# Patient Record
Sex: Male | Born: 1975 | Race: White | Hispanic: No | Marital: Married | Smoking: Never smoker
Health system: Southern US, Community
[De-identification: ages and names within clinical notes are randomized; demographics above are authoritative.]

## PROBLEM LIST (undated history)

## (undated) DIAGNOSIS — N200 Calculus of kidney: Secondary | ICD-10-CM

## (undated) DIAGNOSIS — J45909 Unspecified asthma, uncomplicated: Secondary | ICD-10-CM

---

## 1999-07-23 ENCOUNTER — Emergency Department (HOSPITAL_COMMUNITY): Admission: EM | Admit: 1999-07-23 | Discharge: 1999-07-23 | Payer: Self-pay | Admitting: Emergency Medicine

## 2000-07-11 ENCOUNTER — Emergency Department (HOSPITAL_COMMUNITY): Admission: EM | Admit: 2000-07-11 | Discharge: 2000-07-11 | Payer: Self-pay | Admitting: *Deleted

## 2006-11-07 ENCOUNTER — Emergency Department (HOSPITAL_COMMUNITY): Admission: EM | Admit: 2006-11-07 | Discharge: 2006-11-07 | Payer: Self-pay | Admitting: Emergency Medicine

## 2006-12-29 ENCOUNTER — Emergency Department (HOSPITAL_COMMUNITY): Admission: EM | Admit: 2006-12-29 | Discharge: 2006-12-29 | Payer: Self-pay | Admitting: Emergency Medicine

## 2008-03-08 ENCOUNTER — Emergency Department (HOSPITAL_COMMUNITY): Admission: EM | Admit: 2008-03-08 | Discharge: 2008-03-08 | Payer: Self-pay | Admitting: Emergency Medicine

## 2009-03-07 ENCOUNTER — Emergency Department (HOSPITAL_COMMUNITY): Admission: EM | Admit: 2009-03-07 | Discharge: 2009-03-07 | Payer: Self-pay | Admitting: Emergency Medicine

## 2010-10-23 ENCOUNTER — Emergency Department (HOSPITAL_COMMUNITY)
Admission: EM | Admit: 2010-10-23 | Discharge: 2010-10-23 | Disposition: A | Discharge status: Home / Self Care | Payer: Self-pay | Attending: Emergency Medicine | Admitting: Emergency Medicine

## 2010-10-23 DIAGNOSIS — K029 Dental caries, unspecified: Secondary | ICD-10-CM | POA: Insufficient documentation

## 2010-10-23 DIAGNOSIS — K089 Disorder of teeth and supporting structures, unspecified: Secondary | ICD-10-CM | POA: Insufficient documentation

## 2010-11-03 ENCOUNTER — Emergency Department (HOSPITAL_COMMUNITY): Payer: Self-pay

## 2010-11-03 ENCOUNTER — Emergency Department (HOSPITAL_COMMUNITY)
Admission: EM | Admit: 2010-11-03 | Discharge: 2010-11-03 | Disposition: A | Discharge status: Home / Self Care | Payer: Self-pay | Attending: Emergency Medicine | Admitting: Emergency Medicine

## 2010-11-03 DIAGNOSIS — M549 Dorsalgia, unspecified: Secondary | ICD-10-CM | POA: Insufficient documentation

## 2010-11-03 DIAGNOSIS — X503XXA Overexertion from repetitive movements, initial encounter: Secondary | ICD-10-CM | POA: Insufficient documentation

## 2010-11-03 DIAGNOSIS — S335XXA Sprain of ligaments of lumbar spine, initial encounter: Secondary | ICD-10-CM | POA: Insufficient documentation

## 2011-03-20 LAB — RAPID STREP SCREEN (MED CTR MEBANE ONLY): Streptococcus, Group A Screen (Direct): POSITIVE — AB

## 2012-06-09 ENCOUNTER — Emergency Department (HOSPITAL_COMMUNITY): Payer: Self-pay

## 2012-06-09 ENCOUNTER — Encounter (HOSPITAL_COMMUNITY): Payer: Self-pay | Admitting: Emergency Medicine

## 2012-06-09 ENCOUNTER — Emergency Department (HOSPITAL_COMMUNITY)
Admission: EM | Admit: 2012-06-09 | Discharge: 2012-06-09 | Disposition: A | Discharge status: Home / Self Care | Payer: Self-pay | Attending: Emergency Medicine | Admitting: Emergency Medicine

## 2012-06-09 DIAGNOSIS — J069 Acute upper respiratory infection, unspecified: Secondary | ICD-10-CM | POA: Insufficient documentation

## 2012-06-09 DIAGNOSIS — Z79899 Other long term (current) drug therapy: Secondary | ICD-10-CM | POA: Insufficient documentation

## 2012-06-09 DIAGNOSIS — R059 Cough, unspecified: Secondary | ICD-10-CM | POA: Insufficient documentation

## 2012-06-09 DIAGNOSIS — R05 Cough: Secondary | ICD-10-CM | POA: Insufficient documentation

## 2012-06-09 LAB — POCT I-STAT, CHEM 8
BUN: 15 mg/dL (ref 6–23)
Chloride: 103 mEq/L (ref 96–112)
Sodium: 142 mEq/L (ref 135–145)
TCO2: 29 mmol/L (ref 0–100)

## 2012-06-09 MED ORDER — AZITHROMYCIN 250 MG PO TABS: ORAL_TABLET | ORAL | Status: DC

## 2012-06-09 NOTE — ED Notes (Signed)
Provided pt with ginger ale for PO challenge.

## 2012-06-09 NOTE — ED Notes (Signed)
Pt reports cough, fever, chest wall pain, generalized aching x 10 days. Loose stools x 5 days. Denies shortness of breath , denies headache at present. Self medicated with sudafed, tylenol, zyrtac

## 2012-06-09 NOTE — ED Provider Notes (Signed)
Medical screening examination/treatment/procedure(s) were performed by non-physician practitioner and as supervising physician I was immediately available for consultation/collaboration.  Derwood Kaplan, MD 06/09/12 1742

## 2012-06-09 NOTE — ED Provider Notes (Signed)
History     CSN: [id number]  Arrival date & time 06/09/12  1049   First MD Initiated Contact with Patient 06/09/12 1119      Chief Complaint  Patient presents with  . Fever  . Cough    productive cough x 1.5 weeks  . Chills    (Consider location/radiation/quality/duration/timing/severity/associated sxs/prior treatment) HPI  Jacob Mayer is a 37 y.o. male complaining of a week to week and subjective fever, intermittently productive cough, loose stools and myalgia. Patient reports feeling better and then she returned today. He denies nausea vomiting, headache,  shortness of breath.   History reviewed. No pertinent past medical history.  History reviewed. No pertinent past surgical history.  Family History  Problem Relation Age of Onset  . Diabetes Mother   . Migraines Mother   . Asthma Mother     History  Substance Use Topics  . Smoking status: Never Smoker   . Smokeless tobacco: Not on file  . Alcohol Use: No      Review of Systems  Constitutional: Negative for fever.  Respiratory: Positive for cough. Negative for shortness of breath.   Cardiovascular: Negative for chest pain.  Gastrointestinal: Negative for nausea, vomiting, abdominal pain and diarrhea.  All other systems reviewed and are negative.    Allergies  Aspirin  Home Medications   Current Outpatient Rx  Name  Route  Sig  Dispense  Refill  . ACETAMINOPHEN 325 MG PO TABS   Oral   Take 650 mg by mouth every 6 (six) hours as needed. Fever         . ALBUTEROL SULFATE HFA 108 (90 BASE) MCG/ACT IN AERS   Inhalation   Inhale 2 puffs into the lungs every 6 (six) hours as needed. Shortness of breath         . CETIRIZINE HCL 10 MG PO TABS   Oral   Take 10 mg by mouth daily.         Marland Kitchen FLUTICASONE PROPIONATE 50 MCG/ACT NA SUSP   Nasal   Place 2 sprays into the nose daily.         Marland Kitchen MENTHOL 10 MG MT LOZG   Mouth/Throat   Use as directed 2 lozenges in the mouth or throat.         Marland Kitchen  PSEUDOEPHEDRINE HCL 30 MG PO TABS   Oral   Take 30 mg by mouth every 4 (four) hours as needed. Cold sympt.         Marland Kitchen PSEUDOEPHEDRINE-ACETAMINOPHEN 30-500 MG PO TABS   Oral   Take 1 tablet by mouth every 4 (four) hours as needed. Pain           BP 125/81  Pulse 88  Temp 99.4 F (37.4 C) (Oral)  Resp 20  Ht 5\' 9"  (1.753 m)  Wt 145 lb (65.772 kg)  BMI 21.41 kg/m2  SpO2 97%  Physical Exam  Nursing note and vitals reviewed. Constitutional: He is oriented to person, place, and time. He appears well-developed and well-nourished. No distress.  HENT:  Head: Normocephalic and atraumatic.  Mouth/Throat: Oropharynx is clear and moist.  Eyes: Conjunctivae normal and EOM are normal. Pupils are equal, round, and reactive to light.  Neck: Normal range of motion.  Cardiovascular: Normal rate, regular rhythm and intact distal pulses.   No murmur heard. Pulmonary/Chest: Effort normal and breath sounds normal. No stridor. No respiratory distress. He has no wheezes. He has no rales. He exhibits no tenderness.  Abdominal: Soft.  Bowel sounds are normal. He exhibits no distension and no mass. There is no tenderness. There is no rebound and no guarding.  Musculoskeletal: Normal range of motion.  Neurological: He is alert and oriented to person, place, and time.  Psychiatric: He has a normal mood and affect.    ED Course  Procedures (including critical care time)   Labs Reviewed  POCT I-STAT, CHEM 8   Dg Chest 2 View  06/09/2012  *RADIOLOGY REPORT*  Clinical Data: Shortness of breath.  Cough.  Fever.  CHEST - 2 VIEW  Comparison: None.  Findings: Cardiac and mediastinal contours appear normal.  The lungs appear clear.  No pleural effusion is identified.  IMPRESSION:  No significant abnormality identified.   Original Report Authenticated By: Gaylyn Rong, M.D.      1. URI (upper respiratory infection)       MDM   Patient with 7-10 days of URI like illness. I-STAT and chest x-ray  are clear. I will give a when necessary prescription for Z-Pak.   Pt verbalized understanding and agrees with care plan. Outpatient follow-up and return precautions given.    New Prescriptions   AZITHROMYCIN (ZITHROMAX Z-PAK) 250 MG TABLET    2 po day one, then 1 daily x 4 days          Wynetta Emery, PA-C 06/09/12 1228

## 2012-11-30 ENCOUNTER — Encounter (HOSPITAL_COMMUNITY): Payer: Self-pay | Admitting: Physical Medicine and Rehabilitation

## 2012-11-30 ENCOUNTER — Emergency Department (HOSPITAL_COMMUNITY)
Admission: EM | Admit: 2012-11-30 | Discharge: 2012-11-30 | Disposition: A | Discharge status: Home / Self Care | Payer: Self-pay | Attending: Emergency Medicine | Admitting: Emergency Medicine

## 2012-11-30 DIAGNOSIS — Z79899 Other long term (current) drug therapy: Secondary | ICD-10-CM | POA: Insufficient documentation

## 2012-11-30 DIAGNOSIS — Z23 Encounter for immunization: Secondary | ICD-10-CM | POA: Insufficient documentation

## 2012-11-30 DIAGNOSIS — S65212A Laceration of superficial palmar arch of left hand, initial encounter: Secondary | ICD-10-CM

## 2012-11-30 DIAGNOSIS — Y9389 Activity, other specified: Secondary | ICD-10-CM | POA: Insufficient documentation

## 2012-11-30 DIAGNOSIS — Y929 Unspecified place or not applicable: Secondary | ICD-10-CM | POA: Insufficient documentation

## 2012-11-30 DIAGNOSIS — W260XXA Contact with knife, initial encounter: Secondary | ICD-10-CM | POA: Insufficient documentation

## 2012-11-30 DIAGNOSIS — S61409A Unspecified open wound of unspecified hand, initial encounter: Secondary | ICD-10-CM | POA: Insufficient documentation

## 2012-11-30 MED ORDER — TETANUS-DIPHTH-ACELL PERTUSSIS 5-2.5-18.5 LF-MCG/0.5 IM SUSP
0.5000 mL | Freq: Once | INTRAMUSCULAR | Status: AC
  Administered 2012-11-30: 0.5 mL via INTRAMUSCULAR
  Filled 2012-11-30: qty 0.5

## 2012-11-30 NOTE — ED Notes (Signed)
Pt presents to department for evaluation of L hand laceration. States he was playing with knife and accidentally cut hand. 0.5inch laceration noted to palm of hand, bleeding controlled upon arrival to ED. Tetanus unknown. Pt is conscious alert and oriented x4.

## 2012-11-30 NOTE — ED Provider Notes (Signed)
Medical screening examination/treatment/procedure(s) were performed by non-physician practitioner and as supervising physician I was immediately available for consultation/collaboration.   Rolan Bucco, MD 11/30/12 [phone]

## 2012-11-30 NOTE — ED Notes (Signed)
Washed laceration with wound cleanser and then applied 4x4, wrapped with dry gauze and taped

## 2012-11-30 NOTE — ED Provider Notes (Signed)
History    This chart was scribed for non-physician practitioner Dierdre Forth PA-C working with Rolan Bucco, MD by Smitty Pluck, ED scribe. This patient was seen in room TR08C/TR08C and the patient's care was started at 6:38PM.  CSN: [id number] Arrival date & time 11/30/12  1749    Chief Complaint  Patient presents with  . Laceration    Patient is a 37 y.o. male presenting with skin laceration. The history is provided by the patient and medical records. No language interpreter was used.  Laceration  HPI Comments: Jacob Mayer is a 37 y.o. male who presents to the Emergency Department complaining of left hand laceration onset today. Pt states that he was playing with a knife and accidentally cut his left hand. He states he used peroxide and bleeding is controlled. Pt denies trouble moving left hand, fever, chills, nausea, vomiting, diarrhea, weakness, cough, SOB and any other pain. Pt does not know when his last tetanus shot was.  Nothing makes the pain better or worse.     No past medical history on file. No past surgical history on file. Family History  Problem Relation Age of Onset  . Diabetes Mother   . Migraines Mother   . Asthma Mother    History  Substance Use Topics  . Smoking status: Never Smoker   . Smokeless tobacco: Not on file  . Alcohol Use: No    Review of Systems  Constitutional: Negative for fever, diaphoresis, appetite change, fatigue and unexpected weight change.  HENT: Negative for mouth sores and neck stiffness.   Eyes: Negative for visual disturbance.  Respiratory: Negative for cough, chest tightness, shortness of breath and wheezing.   Cardiovascular: Negative for chest pain.  Gastrointestinal: Negative for nausea, vomiting, abdominal pain, diarrhea and constipation.  Endocrine: Negative for polydipsia, polyphagia and polyuria.  Genitourinary: Negative for dysuria, urgency, frequency and hematuria.  Musculoskeletal: Negative for back pain.   Skin: Positive for wound (left hand laceration). Negative for rash.  Allergic/Immunologic: Negative for immunocompromised state.  Neurological: Negative for syncope, light-headedness and headaches.  Hematological: Does not bruise/bleed easily.  Psychiatric/Behavioral: Negative for sleep disturbance. The patient is not nervous/anxious.     Allergies  Aspirin  Home Medications   Current Outpatient Rx  Name  Route  Sig  Dispense  Refill  . albuterol (PROVENTIL HFA;VENTOLIN HFA) 108 (90 BASE) MCG/ACT inhaler   Inhalation   Inhale 2 puffs into the lungs every 6 (six) hours as needed. Shortness of breath         . Multiple Vitamins-Minerals (MULTIVITAMIN GUMMIES ADULTS) CHEW   Oral   Chew 2 tablets by mouth daily.          BP 157/85  Pulse 79  Temp(Src) 98.2 F (36.8 C) (Oral)  Resp 18  SpO2 97%  Physical Exam  Nursing note and vitals reviewed. Constitutional: He appears well-developed and well-nourished. No distress.  HENT:  Head: Normocephalic and atraumatic.  Eyes: Conjunctivae are normal.  Neck: Normal range of motion.  Cardiovascular: Normal rate, regular rhythm and intact distal pulses.   Capillary refill less than 3 seconds  Pulmonary/Chest: Effort normal and breath sounds normal.  Musculoskeletal: He exhibits tenderness. He exhibits no edema.  Full ROM of all fingers of left hand  Neurological: He is alert. Coordination normal.  sensation intact 5/5 strength of left hand with extension and flexion   Skin: Skin is warm and dry. He is not diaphoretic.  No tenting of the skin  3 cm laceration  to the medial palmer of the left hand.   Psychiatric: He has a normal mood and affect.    ED Course  Procedures (including critical care time) DIAGNOSTIC STUDIES: Oxygen Saturation is 97% on room air, normal by my interpretation.    COORDINATION OF CARE: 6:51 PM Discussed ED treatment with pt and pt agrees.   LACERATION REPAIR PROCEDURE NOTE The patient's  identification was confirmed and consent was obtained. This procedure was performed by Jacob Client Kayleah Appleyard PA-C at 6:51 PM. Site: left medial hand  Sterile procedures observed betadine Anesthetic used (type and amt): 3 ml Suture type/size: 6-O proline Length:3 cm # of Sutures: 4 Technique:simple interrupted Complexitysimple Antibx ointment applied Tetanus ordered Site anesthetized, irrigated with NS, explored without evidence of foreign body, wound well approximated, site covered with dry, sterile dressing.  Patient tolerated procedure well without complications. Instructions for care discussed verbally and patient provided with additional written instructions for homecare and f/u.     Labs Reviewed - No data to display No results found. 1. Laceration of superficial palmar arch of hand, left, initial encounter     MDM  Jacob Mayer presents with laceration.  Tdap booster given.Pressure irrigation performed. Laceration occurred < 8 hours prior to repair which was well tolerated. Pt has no co morbidities to effect normal wound healing. Discussed suture home care w pt and answered questions. Pt to f-u for wound check and suture removal in 7 days. Pt is hemodynamically stable w no complaints prior to dc.   I have also discussed reasons to return immediately to the ER.  Patient expresses understanding and agrees with plan.  I personally performed the services described in this documentation, which was scribed in my presence. The recorded information has been reviewed and is accurate.   Jacob Client Kwynn Schlotter, PA-C 11/30/12 2240

## 2016-10-02 ENCOUNTER — Other Ambulatory Visit: Payer: Self-pay | Admitting: Family Medicine

## 2016-10-02 DIAGNOSIS — R319 Hematuria, unspecified: Secondary | ICD-10-CM

## 2016-10-03 ENCOUNTER — Ambulatory Visit
Admission: RE | Admit: 2016-10-03 | Discharge: 2016-10-03 | Disposition: A | Discharge status: Home / Self Care | Payer: BLUE CROSS/BLUE SHIELD | Source: Ambulatory Visit | Attending: Family Medicine | Admitting: Family Medicine

## 2016-10-03 DIAGNOSIS — R319 Hematuria, unspecified: Secondary | ICD-10-CM

## 2016-10-06 ENCOUNTER — Encounter: Payer: Self-pay | Admitting: *Deleted

## 2016-10-06 ENCOUNTER — Emergency Department
Admission: EM | Admit: 2016-10-06 | Discharge: 2016-10-06 | Disposition: A | Discharge status: Home / Self Care | Payer: BLUE CROSS/BLUE SHIELD | Attending: Emergency Medicine | Admitting: Emergency Medicine

## 2016-10-06 DIAGNOSIS — N200 Calculus of kidney: Secondary | ICD-10-CM | POA: Insufficient documentation

## 2016-10-06 LAB — BASIC METABOLIC PANEL
Anion gap: 7 (ref 5–15)
BUN: 17 mg/dL (ref 6–20)
CHLORIDE: 105 mmol/L (ref 101–111)
CO2: 28 mmol/L (ref 22–32)
Calcium: 8.9 mg/dL (ref 8.9–10.3)
Creatinine, Ser: 1.09 mg/dL (ref 0.61–1.24)
GFR calc Af Amer: >60 mL/min (ref >60)
GFR calc non Af Amer: >60 mL/min (ref >60)
Glucose, Bld: 127 mg/dL — ABNORMAL HIGH (ref 65–99)
POTASSIUM: 3.6 mmol/L (ref 3.5–5.1)
Sodium: 140 mmol/L (ref 135–145)

## 2016-10-06 LAB — URINALYSIS, COMPLETE (UACMP) WITH MICROSCOPIC
BILIRUBIN URINE: NEGATIVE
Bacteria, UA: NONE SEEN
Glucose, UA: NEGATIVE mg/dL
KETONES UR: NEGATIVE mg/dL
LEUKOCYTES UA: NEGATIVE
Nitrite: NEGATIVE
PROTEIN: NEGATIVE mg/dL
Specific Gravity, Urine: 1.024 (ref 1.005–1.030)
pH: 5 (ref 5.0–8.0)

## 2016-10-06 LAB — CBC WITH DIFFERENTIAL/PLATELET
Basophils Absolute: 0 10*3/uL (ref 0–0.1)
Basophils Relative: 0 %
EOS ABS: 0.4 10*3/uL (ref 0–0.7)
Eosinophils Relative: 4 %
HEMATOCRIT: 48.3 % (ref 40.0–52.0)
Hemoglobin: 16.5 g/dL (ref 13.0–18.0)
Lymphocytes Relative: 40 %
Lymphs Abs: 4.5 10*3/uL — ABNORMAL HIGH (ref 1.0–3.6)
MCH: 30 pg (ref 26.0–34.0)
MCHC: 34.1 g/dL (ref 32.0–36.0)
MCV: 87.9 fL (ref 80.0–100.0)
MONO ABS: 1.2 10*3/uL — AB (ref 0.2–1.0)
MONOS PCT: 11 %
Neutro Abs: 5 10*3/uL (ref 1.4–6.5)
Neutrophils Relative %: 45 %
Platelets: 235 10*3/uL (ref 150–440)
RBC: 5.5 MIL/uL (ref 4.40–5.90)
RDW: 13.9 % (ref 11.5–14.5)
WBC: 11.2 10*3/uL — ABNORMAL HIGH (ref 3.8–10.6)

## 2016-10-06 MED ORDER — ONDANSETRON HCL 4 MG/2ML IJ SOLN
4.0000 mg | Freq: Once | INTRAMUSCULAR | Status: AC
  Administered 2016-10-06: 4 mg via INTRAVENOUS
  Filled 2016-10-06: qty 2

## 2016-10-06 MED ORDER — MORPHINE SULFATE (PF) 4 MG/ML IV SOLN
4.0000 mg | Freq: Once | INTRAVENOUS | Status: AC
  Administered 2016-10-06: 4 mg via INTRAVENOUS
  Filled 2016-10-06: qty 1

## 2016-10-06 MED ORDER — KETOROLAC TROMETHAMINE 30 MG/ML IJ SOLN
30.0000 mg | Freq: Once | INTRAMUSCULAR | Status: AC
  Administered 2016-10-06: 30 mg via INTRAVENOUS
  Filled 2016-10-06: qty 1

## 2016-10-06 MED ORDER — SODIUM CHLORIDE 0.9 % IV BOLUS (SEPSIS)
1000.0000 mL | Freq: Once | INTRAVENOUS | Status: AC
  Administered 2016-10-06: 1000 mL via INTRAVENOUS

## 2016-10-06 MED ORDER — ONDANSETRON HCL 4 MG PO TABS: 4.0000 mg | ORAL_TABLET | Freq: Three times a day (TID) | ORAL | 0 refills | Status: DC | PRN

## 2016-10-06 MED ORDER — HYDROCODONE-ACETAMINOPHEN 5-325 MG PO TABS: 1.0000 | ORAL_TABLET | Freq: Four times a day (QID) | ORAL | 0 refills | Status: DC | PRN

## 2016-10-06 NOTE — ED Provider Notes (Signed)
[facility] Emergency Department Provider Note  ____________________________________________   I have reviewed the triage vital signs and the nursing notes.   HISTORY  Chief Complaint Abdominal Pain and Flank Pain   History limited by: Not Limited   HPI Jacob Mayer is a 41 y.o. male who presents to the emergency department today because of concerns for right lower quadrant abdominal pain. The patient first started having pain there for days ago. He had a CT done 3 days ago which showed a 2 mm right-sided ureteral stone. The pain did improve until today when it came back. It is severe. It has not been associated with any vomiting. He was able to urinate without any difficulty today. Patient denies any fevers. He has not had any kidney stones in the past. The patient has not tried any prescription medication for the pain at home.   History reviewed. No pertinent past medical history.  There are no active problems to display for this patient.   History reviewed. No pertinent surgical history.  Prior to Admission medications   Medication Sig Start Date End Date Taking? Authorizing Provider  albuterol (PROVENTIL HFA;VENTOLIN HFA) 108 (90 BASE) MCG/ACT inhaler Inhale 2 puffs into the lungs every 6 (six) hours as needed. Shortness of breath    Historical Provider, MD  Multiple Vitamins-Minerals (MULTIVITAMIN GUMMIES ADULTS) CHEW Chew 2 tablets by mouth daily.    Historical Provider, MD    Allergies Aspirin  Family History  Problem Relation Age of Onset  . Diabetes Mother   . Migraines Mother   . Asthma Mother     Social History Social History  Substance Use Topics  . Smoking status: Never Smoker  . Smokeless tobacco: Not on file  . Alcohol use No    Review of Systems Constitutional: No fever/chills Eyes: No visual changes. ENT: No sore throat. Cardiovascular: Denies chest pain. Respiratory: Denies shortness of breath. Gastrointestinal:  Positive for right lower quadrant abdominal pain. Genitourinary: Negative for dysuria. Musculoskeletal: Negative for back pain. Skin: Negative for rash. Neurological: Negative for headaches, focal weakness or numbness.  ____________________________________________   PHYSICAL EXAM:  VITAL SIGNS: ED Triage Vitals  Enc Vitals Group     BP 10/06/16 0753 (!) 166/100     Pulse Rate 10/06/16 0753 (!) 58     Resp 10/06/16 0753 20     Temp 10/06/16 0753 97.8 F (36.6 C)     Temp Source 10/06/16 0753 Oral     SpO2 10/06/16 0753 100 %     Weight 10/06/16 0748 180 lb (81.6 kg)     Height 10/06/16 0748 5\' 9"  (1.753 m)     Head Circumference --      Peak Flow --      Pain Score 10/06/16 0747 9   Constitutional: Alert and oriented. Appears uncomfortable.  Eyes: Conjunctivae are normal. Normal extraocular movements. ENT   Head: Normocephalic and atraumatic.   Nose: No congestion/rhinnorhea.   Mouth/Throat: Mucous membranes are moist.   Neck: No stridor. Hematological/Lymphatic/Immunilogical: No cervical lymphadenopathy. Cardiovascular: Normal rate, regular rhythm.  No murmurs, rubs, or gallops.  Respiratory: Normal respiratory effort without tachypnea nor retractions. Breath sounds are clear and equal bilaterally. No wheezes/rales/rhonchi. Gastrointestinal: Soft and non tender. No rebound. No guarding.  Genitourinary: Deferred Musculoskeletal: Normal range of motion in all extremities. No lower extremity edema. Neurologic:  Normal speech and language. No gross focal neurologic deficits are appreciated.  Skin:  Skin is warm, dry and intact. No rash noted.  Psychiatric: Mood and affect are normal. Speech and behavior are normal. Patient exhibits appropriate insight and judgment.  ____________________________________________    LABS (pertinent positives/negatives)  Labs Reviewed  CBC WITH DIFFERENTIAL/PLATELET - Abnormal; Notable for the following:       Result Value   WBC  11.2 (*)    Lymphs Abs 4.5 (*)    Monocytes Absolute 1.2 (*)    All other components within normal limits  BASIC METABOLIC PANEL - Abnormal; Notable for the following:    Glucose, Bld 127 (*)    All other components within normal limits  URINALYSIS, COMPLETE (UACMP) WITH MICROSCOPIC - Abnormal; Notable for the following:    Color, Urine YELLOW (*)    APPearance CLEAR (*)    Hgb urine dipstick MODERATE (*)    Squamous Epithelial / LPF 0-5 (*)    All other components within normal limits     ____________________________________________   EKG  None  ____________________________________________    RADIOLOGY  None  ____________________________________________   PROCEDURES  Procedures  ____________________________________________   INITIAL IMPRESSION / ASSESSMENT AND PLAN / ED COURSE  Pertinent labs & imaging results that were available during my care of the patient were reviewed by me and considered in my medical decision making (see chart for details).  Patient presented to the emergency department today because of concerns for right lower quadrant pain. Patient had diagnoses of kidney stone via CT scan a couple of days ago. Urine continues to have some red blood cells. At this point however no white blood cells in the urine or elevation of creatinine. Patient did feel improved after pain medication. At this point do not think repeat imaging is necessary. Will plan on discharging with pain medication. Checked [place] drug database.  ____________________________________________   FINAL CLINICAL IMPRESSION(S) / ED DIAGNOSES  Final diagnoses:  Kidney stone     Note: This dictation was prepared with Dragon dictation. Any transcriptional errors that result from this process are unintentional     Phineas SemenGraydon Keyoni Lapinski, MD 10/06/16 1200

## 2016-10-06 NOTE — Discharge Instructions (Signed)
Please seek medical attention for any high fevers, chest pain, shortness of breath, change in behavior, persistent vomiting, bloody stool or any other new or concerning symptoms.  

## 2016-10-06 NOTE — ED Triage Notes (Signed)
Pt was seen Monday diagnosed with a kidney stone, pt reports increased rigght sided abdominal pain

## 2017-10-22 ENCOUNTER — Other Ambulatory Visit: Payer: Self-pay

## 2017-10-22 ENCOUNTER — Emergency Department
Admission: EM | Admit: 2017-10-22 | Discharge: 2017-10-22 | Disposition: A | Discharge status: Home / Self Care | Payer: Self-pay | Attending: Emergency Medicine | Admitting: Emergency Medicine

## 2017-10-22 ENCOUNTER — Encounter: Payer: Self-pay | Admitting: Emergency Medicine

## 2017-10-22 DIAGNOSIS — Z79899 Other long term (current) drug therapy: Secondary | ICD-10-CM | POA: Insufficient documentation

## 2017-10-22 DIAGNOSIS — J029 Acute pharyngitis, unspecified: Secondary | ICD-10-CM | POA: Insufficient documentation

## 2017-10-22 HISTORY — DX: Calculus of kidney: N20.0

## 2017-10-22 LAB — GROUP A STREP BY PCR: Group A Strep by PCR: NOT DETECTED

## 2017-10-22 MED ORDER — MAGIC MOUTHWASH W/LIDOCAINE: 5.0000 mL | Freq: Four times a day (QID) | ORAL | 0 refills | Status: DC

## 2017-10-22 MED ORDER — PSEUDOEPH-BROMPHEN-DM 30-2-10 MG/5ML PO SYRP: 5.0000 mL | ORAL_SOLUTION | Freq: Four times a day (QID) | ORAL | 0 refills | Status: DC | PRN

## 2017-10-22 MED ORDER — LIDOCAINE VISCOUS HCL 2 % MT SOLN
15.0000 mL | Freq: Once | OROMUCOSAL | Status: AC
  Administered 2017-10-22: 15 mL via OROMUCOSAL
  Filled 2017-10-22: qty 15

## 2017-10-22 MED ORDER — DIPHENHYDRAMINE HCL 12.5 MG/5ML PO ELIX
12.5000 mg | ORAL_SOLUTION | Freq: Once | ORAL | Status: AC
  Administered 2017-10-22: 12.5 mg via ORAL
  Filled 2017-10-22: qty 5

## 2017-10-22 NOTE — ED Triage Notes (Signed)
Sore throat since Saturday. No fever.

## 2017-10-22 NOTE — ED Provider Notes (Signed)
[facility] Emergency Department Provider Note   ____________________________________________   First MD Initiated Contact with Patient 10/22/17 1052     (approximate)  I have reviewed the triage vital signs and the nursing notes.   HISTORY  Chief Complaint Sore Throat    HPI Jacob Mayer is a 42 y.o. male patient presents with 2 days of sore throat.  Patient denies fever associated complaint.  Patient also complained of nasal congestion, intermittent rhinorrhea, and a nonproductive cough.  Patient denies nausea, vomiting, diarrhea.  Patient states able to tolerate food and fluids with difficulty.  Patient rates his pain as a 8/10.  Patient described the pain is "sore".  No palliative measures for complaint.  Past Medical History:  Diagnosis Date  . Kidney stone     There are no active problems to display for this patient.   History reviewed. No pertinent surgical history.  Prior to Admission medications   Medication Sig Start Date End Date Taking? Authorizing Provider  albuterol (PROVENTIL HFA;VENTOLIN HFA) 108 (90 BASE) MCG/ACT inhaler Inhale 2 puffs into the lungs every 6 (six) hours as needed. Shortness of breath    [provider]  brompheniramine-pseudoephedrine-DM 30-2-10 MG/5ML syrup Take 5 mLs by mouth 4 (four) times daily as needed. 10/22/17   Joni Reining, PA-C  HYDROcodone-acetaminophen (NORCO) 5-325 MG tablet Take 1 tablet by mouth every 6 (six) hours as needed for moderate pain or severe pain. 10/06/16   Phineas Semen, MD  magic mouthwash w/lidocaine SOLN Take 5 mLs by mouth 4 (four) times daily. 10/22/17   Joni Reining, PA-C  Multiple Vitamins-Minerals (MULTIVITAMIN GUMMIES ADULTS) CHEW Chew 2 tablets by mouth daily.    [provider]  ondansetron (ZOFRAN) 4 MG tablet Take 1 tablet (4 mg total) by mouth every 8 (eight) hours as needed for nausea or vomiting. 10/06/16   Phineas Semen, MD  ondansetron (ZOFRAN)  4 MG tablet Take 1 tablet (4 mg total) by mouth every 8 (eight) hours as needed for nausea or vomiting. 10/06/16   Phineas Semen, MD  tamsulosin (FLOMAX) 0.4 MG CAPS capsule Take 0.4 mg by mouth daily. 10/02/16   [provider]    Allergies Aspirin  Family History  Problem Relation Age of Onset  . Diabetes Mother   . Migraines Mother   . Asthma Mother     Social History Social History   Tobacco Use  . Smoking status: Never Smoker  . Smokeless tobacco: Never Used  Substance Use Topics  . Alcohol use: No  . Drug use: Not on file    Review of Systems  Constitutional: No fever/chills Eyes: No visual changes. ENT: Sore throat and nasal congestion.  Cardiovascular: Denies chest pain. Respiratory: Denies shortness of breath.  Nonproductive cough. Gastrointestinal: No abdominal pain.  No nausea, no vomiting.  No diarrhea.  No constipation. Genitourinary: Negative for dysuria. Musculoskeletal: Negative for back pain. Skin: Negative for rash. Neurological: Negative for headaches, focal weakness or numbness. Allergic/Immunilogical: Aspirin. ____________________________________________   PHYSICAL EXAM:  VITAL SIGNS: ED Triage Vitals  Enc Vitals Group     BP 10/22/17 1105 129/68     Pulse Rate 10/22/17 1105 83     Resp 10/22/17 1105 18     Temp 10/22/17 1105 98.3 F (36.8 C)     Temp Source 10/22/17 1105 Oral     SpO2 10/22/17 1105 97 %     Weight 10/22/17 1056 180 lb (81.6 kg)     Height  10/22/17 1056  (1.753 m)     Head Circumference --      Peak Flow --      Pain Score 10/22/17 1056 8     Pain Loc --      Pain Edu? --      Excl. in GC? --     Constitutional: Alert and oriented. Well appearing and in no acute distress. Nose: Edematous nasal turbinates clear rhinorrhea.   Mouth/Throat: Mucous membranes are moist.  Oropharynx non-erythematous. Neck: No stridor.  Hematological/Lymphatic/Immunilogical: No cervical lymphadenopathy. Cardiovascular:  Normal rate, regular rhythm. Grossly normal heart sounds.  Good peripheral circulation. Respiratory: Normal respiratory effort.  No retractions. Lungs CTAB. Musculoskeletal: No lower extremity tenderness nor edema.  No joint effusions. Neurologic:  Normal speech and language. No gross focal neurologic deficits are appreciated. No gait instability. Skin:  Skin is warm, dry and intact. No rash noted. Psychiatric: Mood and affect are normal. Speech and behavior are normal.  ____________________________________________   LABS (all labs ordered are listed, but only abnormal results are displayed)  Labs Reviewed  GROUP A STREP BY PCR   ____________________________________________  EKG   ____________________________________________  RADIOLOGY  ED MD interpretation:    Official radiology report(s): No results found.  ____________________________________________   PROCEDURES  Procedure(s) performed: None  Procedures  Critical Care performed: No  ____________________________________________   INITIAL IMPRESSION / ASSESSMENT AND PLAN / ED COURSE  As part of my medical decision making, I reviewed the following data within the electronic MEDICAL RECORD NUMBER    Viral pharyngitis and upper respiratory infection.  Discussed negative strep results with patient.  Patient given discharge care instruction advised take medication as directed.  Patient advised follow-up PCP if complaints persist.      ____________________________________________   FINAL CLINICAL IMPRESSION(S) / ED DIAGNOSES  Final diagnoses:  Viral pharyngitis     ED Discharge Orders        Ordered    magic mouthwash w/lidocaine SOLN  4 times daily     10/22/17 1318    brompheniramine-pseudoephedrine-DM 30-2-10 MG/5ML syrup  4 times daily PRN     10/22/17 1318       Note:  This document was prepared using Dragon voice recognition software and may include unintentional dictation errors.    Joni Reining, PA-C 10/22/17 1324    Schaevitz, Myra Rude, MD 10/22/17 1328

## 2017-10-22 NOTE — ED Notes (Signed)
See triage note  Presents with sore throat today  No fever on arrival

## 2017-10-25 NOTE — ED Notes (Signed)
[facility] pharmacy called re:  What the compound information is for magic mouthwash with Lidocaine. [facility] pharmcy called for what the compound information was, pharmacy did not know.  Asked Dr. Roxan Hockey, who stated to not fill RX due to lack of prescribing information.

## 2018-12-09 IMAGING — CT CT ABD-PELV W/O CM
1 of 2 series · 15 of 32 positions shown, 19 images · non-contrast
Comparison: None.

CLINICAL DATA: Hematuria, unspecified type. Patient reports
umbilical pain for 2 days.

EXAM:
CT ABDOMEN AND PELVIS WITHOUT CONTRAST
TECHNIQUE: Multidetector CT imaging of the abdomen and pelvis was performed
following the standard protocol without IV contrast.

[Series 2: abd/pelvis w/(date) · axial · 0.64mm/px · z∈[-515,-50]mm · 15 of 103 slices shown, 19 images]
[im 5/103  soft-tissue]
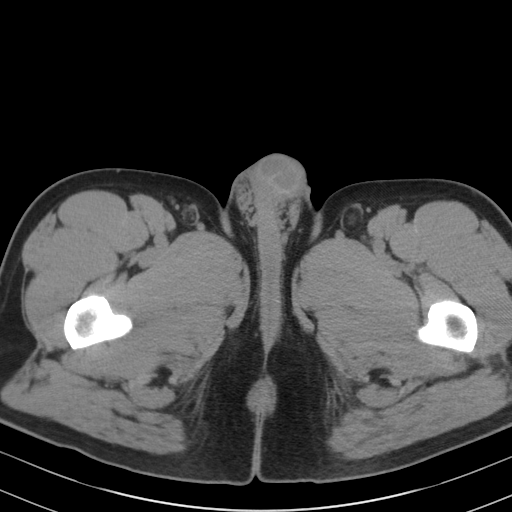
[im 5/103  bone]
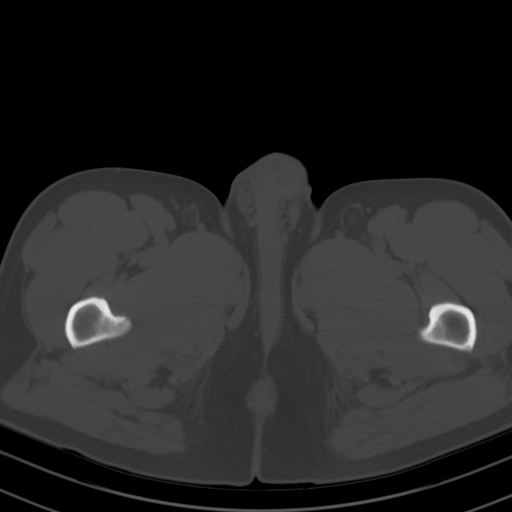
[im 13/103  soft-tissue]
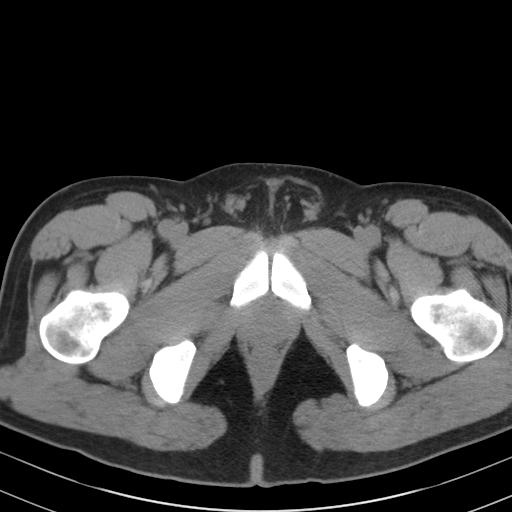
[im 21/103  soft-tissue]
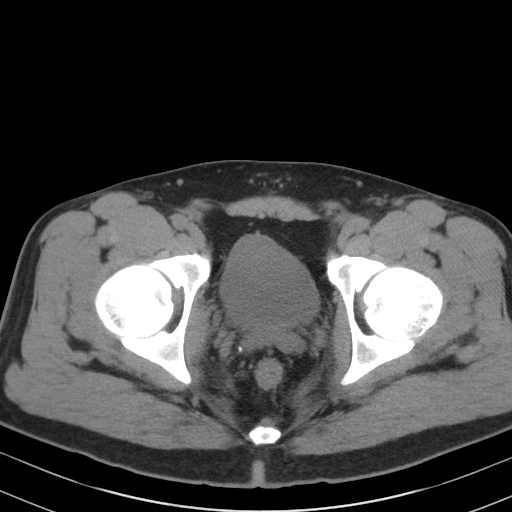
[im 29/103  soft-tissue]
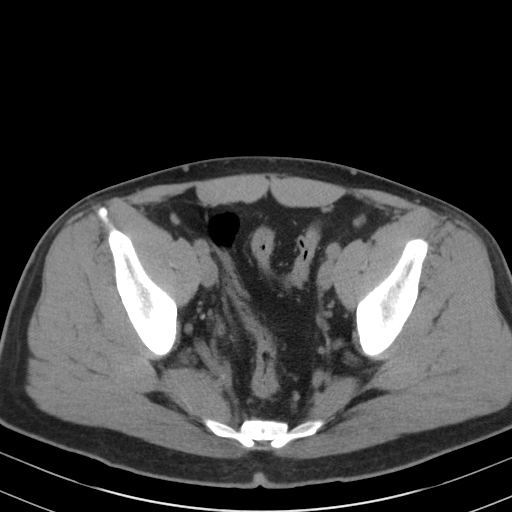
[im 37/103  soft-tissue]
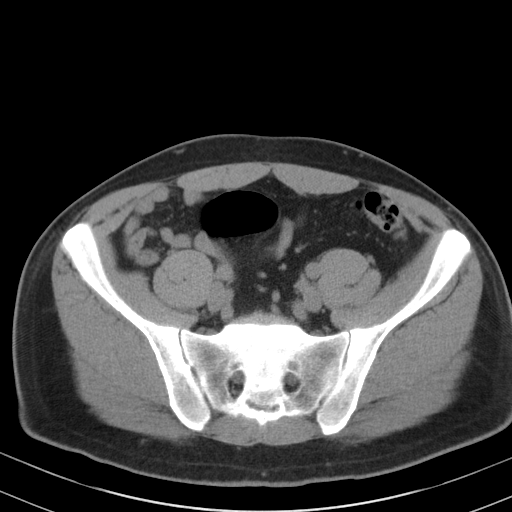
[im 45/103  soft-tissue]
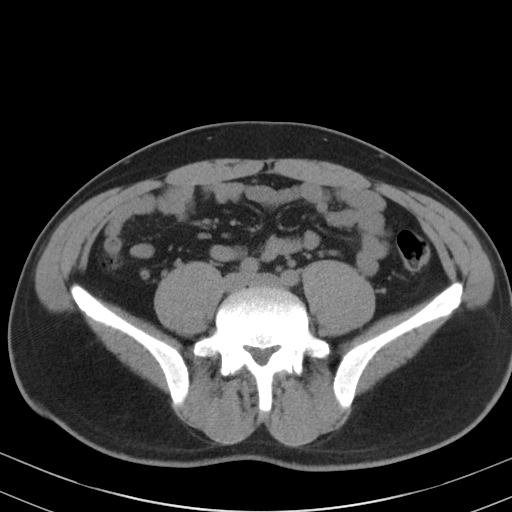
[im 54/103  soft-tissue]
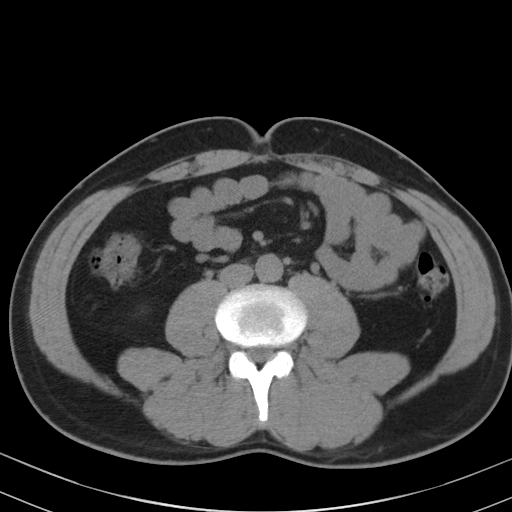
[im 58/103  soft-tissue]
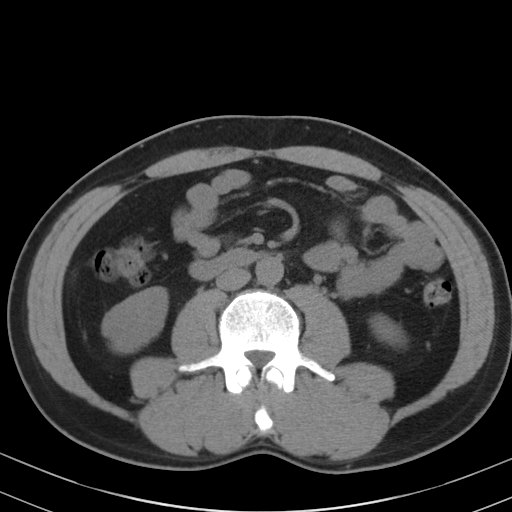
[im 66/103  soft-tissue]
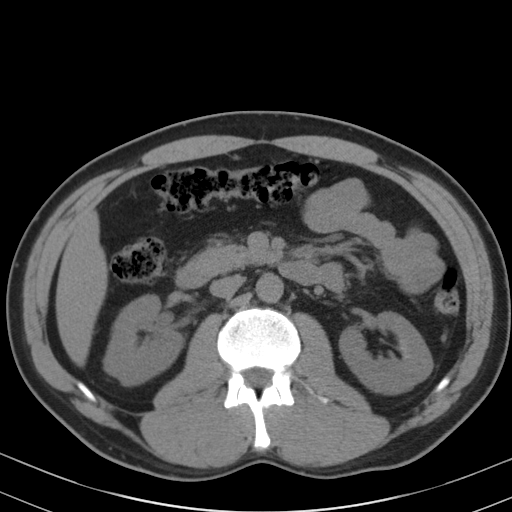
[im 66/103  bone]
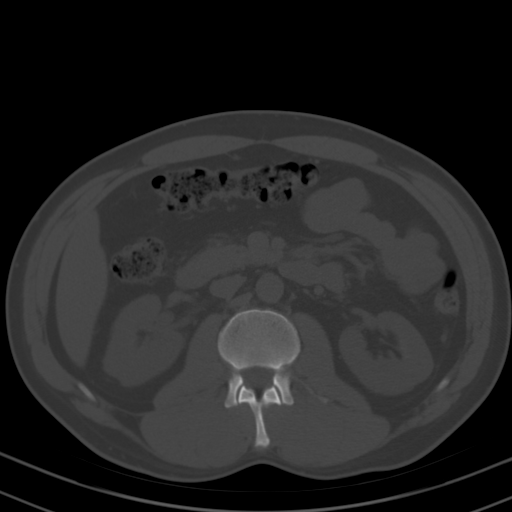
[im 74/103  soft-tissue]
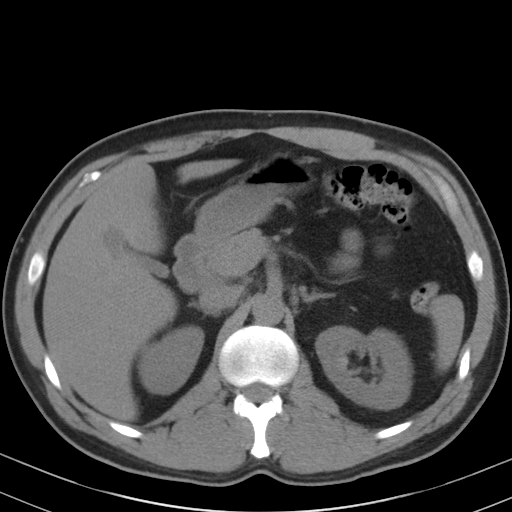
[im 82/103  soft-tissue]
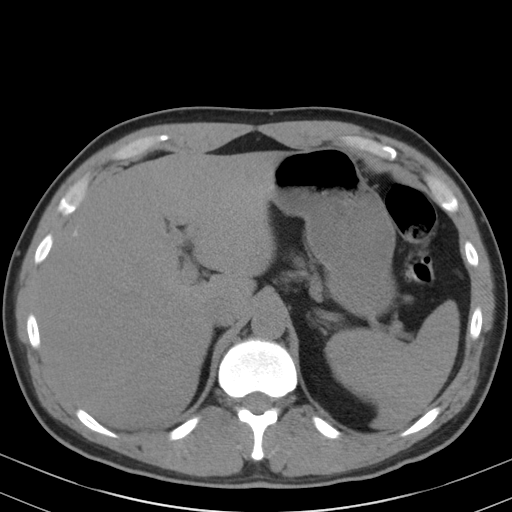
[im 86/103  lung]
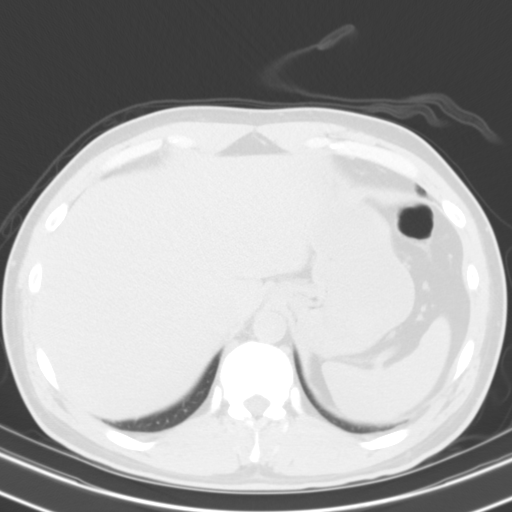
[im 90/103  soft-tissue]
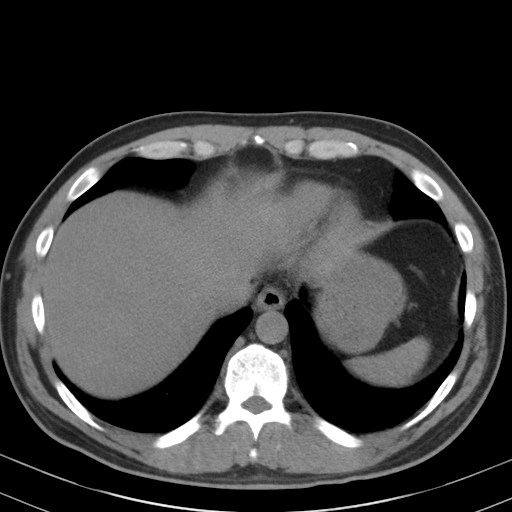
[im 90/103  lung]
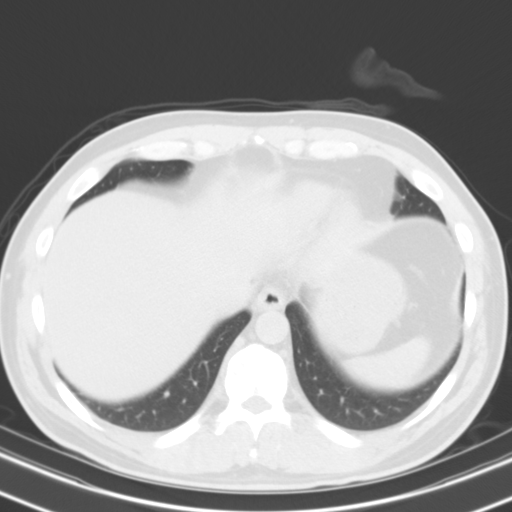
[im 94/103  lung]
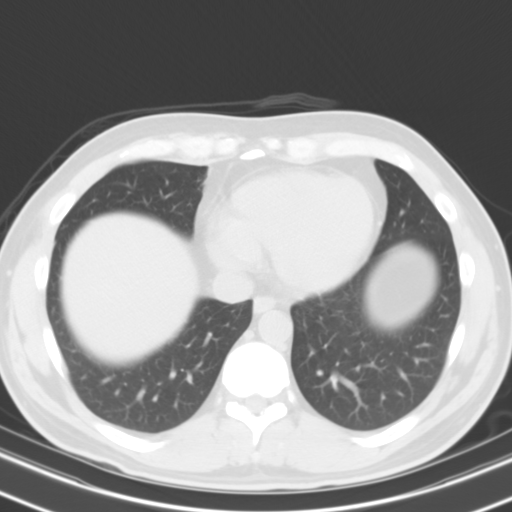
[im 98/103  soft-tissue]
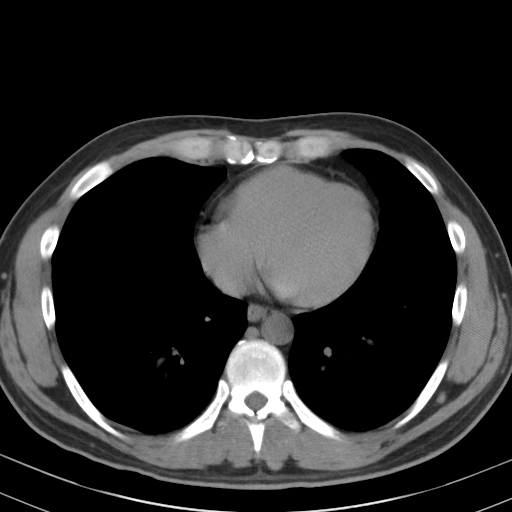
[im 98/103  lung]
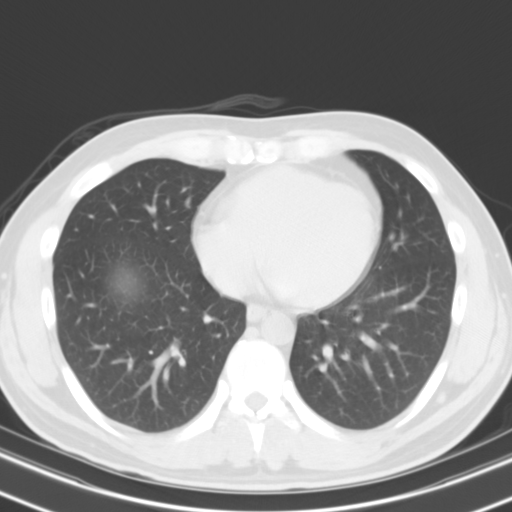

[15 of 32 positions shown; findings below may reference images not displayed]

FINDINGS: Lower chest: No significant pulmonary nodules or acute consolidative
airspace disease.

Hepatobiliary: Normal liver size. Solitary coarse anterior liver
calcification, probably from prior granulomatous disease. No liver
mass. Mild diffuse hepatic steatosis. No definite liver surface
irregularity. Normal gallbladder with no radiopaque cholelithiasis.
No biliary ductal dilatation.

Pancreas: Normal, with no mass or duct dilation.

Spleen: Normal size. No mass.

Adrenals/Urinary Tract: Normal adrenals. No hydronephrosis. No renal
stones. No contour deforming renal mass. There is a 2 mm distal
right pelvic ureteral stone (located approximately 2 cm above the
right ureterovesical junction). No additional right ureteral stones.
No right ureterectasis. Normal caliber left ureter, with no left
ureteral stones. Normal bladder.

Stomach/Bowel: Grossly normal stomach. Normal caliber small bowel
with no small bowel wall thickening. Normal appendix. Normal large
bowel with no diverticulosis, large bowel wall thickening or
pericolonic fat stranding.

Vascular/Lymphatic: Normal caliber abdominal aorta. No
pathologically enlarged lymph nodes in the abdomen or pelvis.

Reproductive: Normal size prostate.

Other: No pneumoperitoneum, ascites or focal fluid collection.

Musculoskeletal: No aggressive appearing focal osseous lesions.
IMPRESSION: 1. Solitary 2 mm distal right pelvic ureteral stone (located
approximately 2 cm above the right UVJ), with no right ureterectasis
and no right hydronephrosis. No additional urolithiasis.
2. Mild diffuse hepatic steatosis.

## 2020-09-13 ENCOUNTER — Emergency Department: Payer: 59

## 2020-09-13 ENCOUNTER — Other Ambulatory Visit: Payer: Self-pay

## 2020-09-13 ENCOUNTER — Encounter: Payer: Self-pay | Admitting: Emergency Medicine

## 2020-09-13 ENCOUNTER — Emergency Department
Admission: EM | Admit: 2020-09-13 | Discharge: 2020-09-13 | Disposition: A | Discharge status: Home / Self Care | Payer: 59 | Attending: Emergency Medicine | Admitting: Emergency Medicine

## 2020-09-13 DIAGNOSIS — X58XXXA Exposure to other specified factors, initial encounter: Secondary | ICD-10-CM | POA: Diagnosis not present

## 2020-09-13 DIAGNOSIS — N5089 Other specified disorders of the male genital organs: Secondary | ICD-10-CM

## 2020-09-13 DIAGNOSIS — S39012A Strain of muscle, fascia and tendon of lower back, initial encounter: Secondary | ICD-10-CM | POA: Diagnosis not present

## 2020-09-13 DIAGNOSIS — N50812 Left testicular pain: Secondary | ICD-10-CM | POA: Insufficient documentation

## 2020-09-13 DIAGNOSIS — S3992XA Unspecified injury of lower back, initial encounter: Secondary | ICD-10-CM | POA: Diagnosis present

## 2020-09-13 LAB — URINALYSIS, COMPLETE (UACMP) WITH MICROSCOPIC
Bilirubin Urine: NEGATIVE
Glucose, UA: NEGATIVE mg/dL
Hgb urine dipstick: NEGATIVE
Ketones, ur: NEGATIVE mg/dL
Leukocytes,Ua: NEGATIVE
Nitrite: NEGATIVE
Protein, ur: NEGATIVE mg/dL
Specific Gravity, Urine: 1.019 (ref 1.005–1.030)
pH: 5 (ref 5.0–8.0)

## 2020-09-13 MED ORDER — ORPHENADRINE CITRATE ER 100 MG PO TB12: 100.0000 mg | ORAL_TABLET | Freq: Two times a day (BID) | ORAL | 1 refills | Status: AC

## 2020-09-13 NOTE — ED Notes (Signed)
See triage note  Presents with bilateral flank pain for about 2 weeks   Also has had some left testicle discomfort for about 1 month  Hx of renal stones but this feels different

## 2020-09-13 NOTE — ED Provider Notes (Signed)
[facility] Emergency Department Provider Note   ____________________________________________   Event Date/Time   First MD Initiated Contact with Patient 09/13/20 [id number]     (approximate)  I have reviewed the triage vital signs and the nursing notes.   HISTORY  Chief Complaint Back Pain and Testicle Pain    HPI Jacob Mayer is a 45 y.o. male patient complain of bilateral flank pain for approximately 2 weeks.  Patient stated no specific provocative incident for complaint but he does install windows.  Patient also complained of left testicle discomfort for 1 month.  Patient relates a history of kidney stones but states this does not feel like that complaint.  Denies dysuria or hematuria.  Rates his pain at a 7/10.  Described pain as "achy".  No palliative measure for complaint.         Past Medical History:  Diagnosis Date  . Kidney stone     There are no problems to display for this patient.   History reviewed. No pertinent surgical history.  Prior to Admission medications   Medication Sig Start Date End Date Taking? Authorizing Provider  orphenadrine (NORFLEX) 100 MG tablet Take 1 tablet (100 mg total) by mouth 2 (two) times daily. 09/13/20 09/13/21 Yes Joni Reining, PA-C  albuterol (PROVENTIL HFA;VENTOLIN HFA) 108 (90 BASE) MCG/ACT inhaler Inhale 2 puffs into the lungs every 6 (six) hours as needed. Shortness of breath    [provider]  Multiple Vitamins-Minerals (MULTIVITAMIN GUMMIES ADULTS) CHEW Chew 2 tablets by mouth daily.    [provider]    Allergies Aspirin  Family History  Problem Relation Age of Onset  . Diabetes Mother   . Migraines Mother   . Asthma Mother     Social History Social History   Tobacco Use  . Smoking status: Never Smoker  . Smokeless tobacco: Never Used  Substance Use Topics  . Alcohol use: No    Review of Systems Constitutional: No fever/chills Eyes: No visual changes. ENT:  No sore throat. Cardiovascular: Denies chest pain. Respiratory: Denies shortness of breath. Gastrointestinal: No abdominal pain.  No nausea, no vomiting.  No diarrhea.  No constipation. Genitourinary: Negative for dysuria.  Left scrotal pain. Musculoskeletal: Lateral flank pain. Skin: Negative for rash. Neurological: Negative for headaches, focal weakness or numbness. Allergic/Immunilogical: Aspirin ____________________________________________   PHYSICAL EXAM:  VITAL SIGNS: ED Triage Vitals [09/13/20 0816]  Enc Vitals Group     BP (!) 134/93     Pulse Rate 82     Resp 18     Temp 97.7 F (36.5 C)     Temp Source Oral     SpO2 98 %     Weight 175 lb (79.4 kg)     Height 5\' 9"  (1.753 m)     Head Circumference      Peak Flow      Pain Score 7     Pain Loc      Pain Edu?      Excl. in [place]?    Constitutional: Alert and oriented. Well appearing and in no acute distress. Cardiovascular: Normal rate, regular rhythm. Grossly normal heart sounds.  Good peripheral circulation. Respiratory: Normal respiratory effort.  No retractions. Lungs CTAB. Gastrointestinal: Soft and nontender. No distention. No abdominal bruits. No CVA tenderness. Genitourinary: No obvious erythema or edema to scrotum area. Musculoskeletal: No lower extremity tenderness nor edema.  No joint effusions.  Patient have bilateral paralumbar spinal muscle spasms with lateral movements. Neurologic:  Normal speech and language. No gross focal neurologic deficits are appreciated. No gait instability. Skin:  Skin is warm, dry and intact. No rash noted. Psychiatric: Mood and affect are normal. Speech and behavior are normal.  ____________________________________________   LABS (all labs ordered are listed, but only abnormal results are displayed)  Labs Reviewed  URINALYSIS, COMPLETE (UACMP) WITH MICROSCOPIC - Abnormal; Notable for the following components:      Result Value   Color, Urine YELLOW (*)    APPearance  CLEAR (*)    All other components within normal limits   ____________________________________________  EKG   ____________________________________________  RADIOLOGY I, Joni Reining, personally viewed and evaluated these images (plain radiographs) as part of my medical decision making, as well as reviewing the written report by the radiologist.  ED MD interpretation:    Official radiology report(s): US SCROTUM W/DOPPLER  Result Date: 09/13/2020 CLINICAL DATA:  Left testicular pain EXAM: SCROTAL ULTRASOUND DOPPLER ULTRASOUND OF THE TESTICLES TECHNIQUE: Complete ultrasound examination of the testicles, epididymis, and other scrotal structures was performed. Color and spectral Doppler ultrasound were also utilized to evaluate blood flow to the testicles. COMPARISON:  None. FINDINGS: Right testicle Measurements: 3.9 x 2.0 x 2.6 cm. No mass. Scattered microcalcifications. Left testicle Measurements: 3.9 x 1.7 x 2.5 cm. No mass. Scattered microcalcifications. Right epididymis:  Normal in size and appearance. Left epididymis:  Normal in size and appearance. Hydrocele:  None visualized. Varicocele:  None visualized. Pulsed Doppler interrogation of both testes demonstrates normal low resistance arterial and venous waveforms bilaterally. IMPRESSION: No testicular mass or evidence of torsion. Bilateral microlithiasis. Current literature suggests that testicular microlithiasis is not a significant independent risk factor for development of testicular carcinoma, and that follow up imaging is not warranted in the absence of other risk factors. Monthly testicular self-examination and annual physical exams are considered appropriate surveillance. If patient has other risk factors for testicular carcinoma, then referral to Urology should be considered. (Reference: DeCastro, et al.: A 5-Year Follow up Study of Asymptomatic Men with Testicular Microlithiasis. J Urol 2008; 179:1420-1423.). Electronically Signed   By:  Charlett Nose M.D.   On: 09/13/2020 09:45    ____________________________________________   PROCEDURES  Procedure(s) performed (including Critical Care):  Procedures   ____________________________________________   INITIAL IMPRESSION / ASSESSMENT AND PLAN / ED COURSE  As part of my medical decision making, I reviewed the following data within the electronic MEDICAL RECORD NUMBER         Patient presents with bilateral flank pain and left scrotum pain.  Discussed ultrasound findings of the scrotum revealing microlithiasis.  Patient complaint physical exam consistent with lumbar strain and scrotum pain.  Patient given discharge care instruction follow-up with urology for definitive evaluation of his scrotum pain.  Patient given a prescription for Norflex and advised follow-up PCP.      ____________________________________________   FINAL CLINICAL IMPRESSION(S) / ED DIAGNOSES  Final diagnoses:  Testicular microlithiasis  Strain of lumbar region, initial encounter     ED Discharge Orders         Ordered    orphenadrine (NORFLEX) 100 MG tablet  2 times daily        09/13/20 1013          *Please note:  Jacob Mayer was evaluated in Emergency Department on 09/13/2020 for the symptoms described in the history of present illness. He was evaluated in the context of the global COVID-19 pandemic, which necessitated consideration that the patient might be at risk  for infection with the SARS-CoV-2 virus that causes COVID-19. Institutional protocols and algorithms that pertain to the evaluation of patients at risk for COVID-19 are in a state of rapid change based on information released by regulatory bodies including the CDC and federal and state organizations. These policies and algorithms were followed during the patient's care in the ED.  Some ED evaluations and interventions may be delayed as a result of limited staffing during and the pandemic.*   Note:  This document was  prepared using Dragon voice recognition software and may include unintentional dictation errors.    Joni Reining, PA-C 09/13/20 1018    Dionne Bucy, MD 09/13/20 1234

## 2020-09-13 NOTE — ED Triage Notes (Signed)
Patient to ER for c/o bilateral lower back pain and left sided testicular pain. Patient has h/o kidney stone, but unable to tell if this pain is related to that (previous kidney stone pain was abdominal, not flank).

## 2020-09-13 NOTE — Discharge Instructions (Signed)
Read and follow discharge care instructions.  Take medication as directed.  Follow-up with urology as listed in your discharge care instructions for your scrotum pain.

## 2022-07-09 ENCOUNTER — Ambulatory Visit (HOSPITAL_COMMUNITY)
Admission: EM | Admit: 2022-07-09 | Discharge: 2022-07-09 | Disposition: A | Discharge status: Home / Self Care | Payer: Commercial Managed Care - HMO | Attending: Internal Medicine | Admitting: Internal Medicine

## 2022-07-09 ENCOUNTER — Encounter (HOSPITAL_COMMUNITY): Payer: Self-pay

## 2022-07-09 DIAGNOSIS — J069 Acute upper respiratory infection, unspecified: Secondary | ICD-10-CM

## 2022-07-09 DIAGNOSIS — J4521 Mild intermittent asthma with (acute) exacerbation: Secondary | ICD-10-CM | POA: Diagnosis not present

## 2022-07-09 HISTORY — DX: Unspecified asthma, uncomplicated: J45.909

## 2022-07-09 LAB — POCT RAPID STREP A, ED / UC: Streptococcus, Group A Screen (Direct): NEGATIVE

## 2022-07-09 MED ORDER — IPRATROPIUM-ALBUTEROL 0.5-2.5 (3) MG/3ML IN SOLN
RESPIRATORY_TRACT | Status: AC
  Filled 2022-07-09: qty 3

## 2022-07-09 MED ORDER — IPRATROPIUM-ALBUTEROL 0.5-2.5 (3) MG/3ML IN SOLN
3.0000 mL | Freq: Once | RESPIRATORY_TRACT | Status: AC
  Administered 2022-07-09: 3 mL via RESPIRATORY_TRACT

## 2022-07-09 MED ORDER — BENZONATATE 100 MG PO CAPS: 100.0000 mg | ORAL_CAPSULE | Freq: Three times a day (TID) | ORAL | 0 refills | Status: AC

## 2022-07-09 MED ORDER — DEXAMETHASONE SODIUM PHOSPHATE 10 MG/ML IJ SOLN
INTRAMUSCULAR | Status: AC
  Filled 2022-07-09: qty 1

## 2022-07-09 MED ORDER — ALBUTEROL SULFATE HFA 108 (90 BASE) MCG/ACT IN AERS: 1.0000 | INHALATION_SPRAY | Freq: Four times a day (QID) | RESPIRATORY_TRACT | 0 refills | Status: AC | PRN

## 2022-07-09 MED ORDER — PROMETHAZINE-DM 6.25-15 MG/5ML PO SYRP: 5.0000 mL | ORAL_SOLUTION | Freq: Every evening | ORAL | 0 refills | Status: AC | PRN

## 2022-07-09 MED ORDER — DEXAMETHASONE SODIUM PHOSPHATE 10 MG/ML IJ SOLN
10.0000 mg | Freq: Once | INTRAMUSCULAR | Status: AC
  Administered 2022-07-09: 10 mg via INTRAMUSCULAR

## 2022-07-09 MED ORDER — PREDNISONE 20 MG PO TABS: 40.0000 mg | ORAL_TABLET | Freq: Every day | ORAL | 0 refills | Status: AC

## 2022-07-09 NOTE — Discharge Instructions (Signed)
You likely have influenza, which triggered your asthma.  The following medicines will help with your symptoms.   - Take steroid sent to pharmacy as directed. Do not take any other NSAID containing medications such as ibuprofen or naproxen/Aleve while taking prednisone. - You may use albuterol inhaler 1 to 2 puffs every 4-6 hours as needed for cough, shortness of breath, and wheezing. - Tessalon perles every 8 hours as needed for cough. - Promethazine DM cough syrup at bedtime as needed.  Do not use this during the day, if you go to work, or drink alcohol when using this as it can make you very sleepy.  - Use guaifenesin every 12 hours as needed OTC for nasal congestion.  If you develop any new or worsening symptoms or do not improve in the next 2 to 3 days, please return.  If your symptoms are severe, please go to the emergency room.  Follow-up with your primary care provider for further evaluation and management of your symptoms as well as ongoing wellness visits.  I hope you feel better!

## 2022-07-09 NOTE — ED Provider Notes (Signed)
[facility]    CSN: [id number] Arrival date & time: 07/09/22  1329      History   Chief Complaint Chief Complaint  Patient presents with  . Fever  . Sore Throat  . Cough    HPI Jacob Mayer is a 47 y.o. male.   Patient presents to urgent care for evaluation of cough, nasal congestion, sore throat, fever/chills, shortness of breath, and chest discomfort with coughing that started 5 days ago.  He states 2 of his children at home tested positive for influenza B last week but have since recovered from illness. He has a history of asthma and has not had a recent asthma exacerbation. Asthma is usually well controlled with as needed use of albuterol inhaler, although he cannot remember the last time he had his albuterol inhaler. No recent antibiotic or steroid use.    Fever Associated symptoms: cough   Sore Throat  Cough Associated symptoms: fever     Past Medical History:  Diagnosis Date  . Asthma   . Kidney stone     There are no problems to display for this patient.   History reviewed. No pertinent surgical history.     Home Medications    Prior to Admission medications   Medication Sig Start Date End Date Taking? Authorizing Provider  albuterol (PROVENTIL HFA;VENTOLIN HFA) 108 (90 BASE) MCG/ACT inhaler Inhale 2 puffs into the lungs every 6 (six) hours as needed. Shortness of breath    [provider]  Multiple Vitamins-Minerals (MULTIVITAMIN GUMMIES ADULTS) CHEW Chew 2 tablets by mouth daily.    [provider]    Family History Family History  Problem Relation Age of Onset  . Diabetes Mother   . Migraines Mother   . Asthma Mother     Social History Social History   Tobacco Use  . Smoking status: Never  . Smokeless tobacco: Never  Vaping Use  . Vaping Use: Never used  Substance Use Topics  . Alcohol use: No  . Drug use: Never     Allergies   Aspirin   Review of Systems Review of Systems  Constitutional:   Positive for fever.  Respiratory:  Positive for cough.      Physical Exam Triage Vital Signs ED Triage Vitals  Enc Vitals Group     BP 07/09/22 1431 112/83     Pulse Rate 07/09/22 1431 87     Resp 07/09/22 1431 16     Temp 07/09/22 1431 98.5 F (36.9 C)     Temp Source 07/09/22 1431 Oral     SpO2 07/09/22 1431 95 %     Weight 07/09/22 1431 170 lb (77.1 kg)     Height 07/09/22 1431 5\' 7"  (1.702 m)     Head Circumference --      Peak Flow --      Pain Score 07/09/22 1429 7     Pain Loc --      Pain Edu? --      Excl. in [place]? --    No data found.  Updated Vital Signs BP 112/83 (BP Location: Left Arm)   Pulse 87   Temp 98.5 F (36.9 C) (Oral)   Resp 16   Ht 5\' 7"  (1.702 m)   Wt 170 lb (77.1 kg)   SpO2 95%   BMI 26.63 kg/m   Visual Acuity Right Eye Distance:   Left Eye Distance:   Bilateral Distance:    Right Eye Near:   Left  Eye Near:    Bilateral Near:     Physical Exam   UC Treatments / Results  Labs (all labs ordered are listed, but only abnormal results are displayed) Labs Reviewed  POCT RAPID STREP A, ED / UC    EKG   Radiology No results found.  Procedures Procedures (including critical care time)  Medications Ordered in UC Medications  ipratropium-albuterol (DUONEB) 0.5-2.5 (3) MG/3ML nebulizer solution 3 mL (has no administration in time range)    Initial Impression / Assessment and Plan / UC Course  I have reviewed the triage vital signs and the nursing notes.  Pertinent labs & imaging results that were available during my care of the patient were reviewed by me and considered in my medical decision making (see chart for details).     *** Final Clinical Impressions(s) / UC Diagnoses   Final diagnoses:  Viral URI with cough  Mild intermittent asthma with acute exacerbation   Discharge Instructions   None    ED Prescriptions   None    PDMP not reviewed this encounter.

## 2022-07-09 NOTE — ED Triage Notes (Signed)
Chief Complaint: fever, throat on fire, productive cough. Patient has asthma. Slight SOB.   Onset: 5 days   Prescriptions or OTC medications tried: Yes- Nyquil, Ibuprofen, Mucinex    with little relief  Sick exposure: Yes- kids had the flu

## 2022-11-19 IMAGING — US US SCROTUM W/ DOPPLER COMPLETE
1 series · 13 of 25 positions shown · non-contrast
Comparison: None.

CLINICAL DATA: Left testicular pain

EXAM:
SCROTAL ULTRASOUND
DOPPLER ULTRASOUND OF THE TESTICLES
TECHNIQUE: Complete ultrasound examination of the testicles, epididymis, and
other scrotal structures was performed. Color and spectral Doppler
ultrasound were also utilized to evaluate blood flow to the
testicles.

[Series 1: us scrotum w/doppler · 13 of 72 slices shown]
[im 1/72]
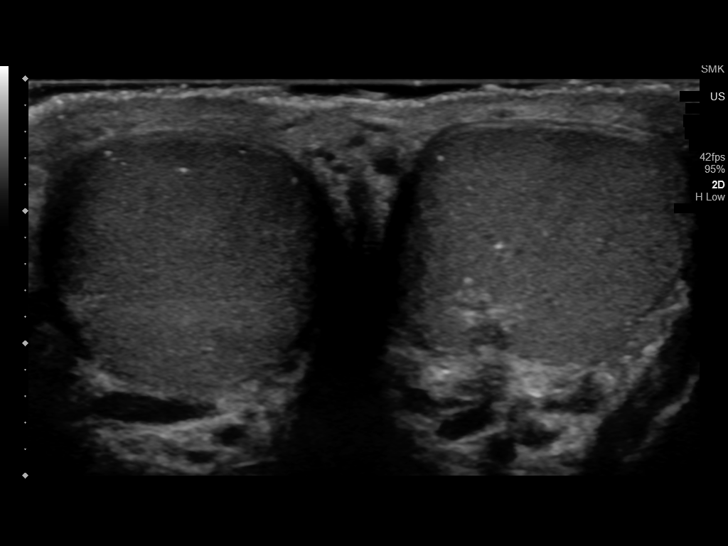
[im 6/72]
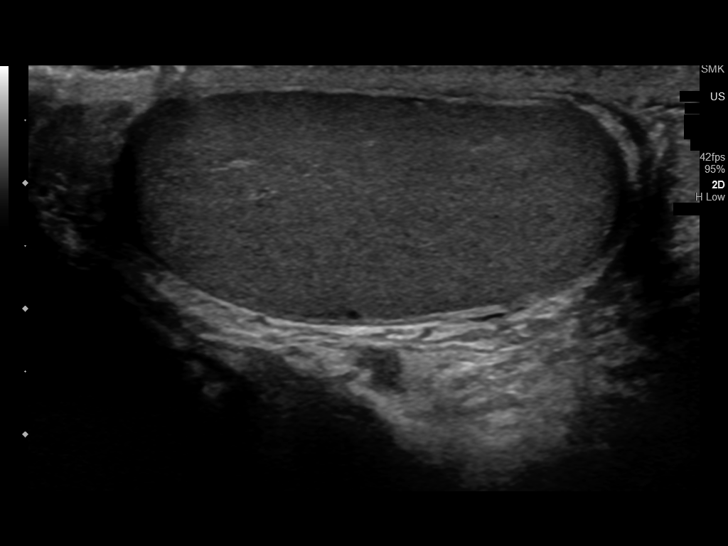
[im 12/72]
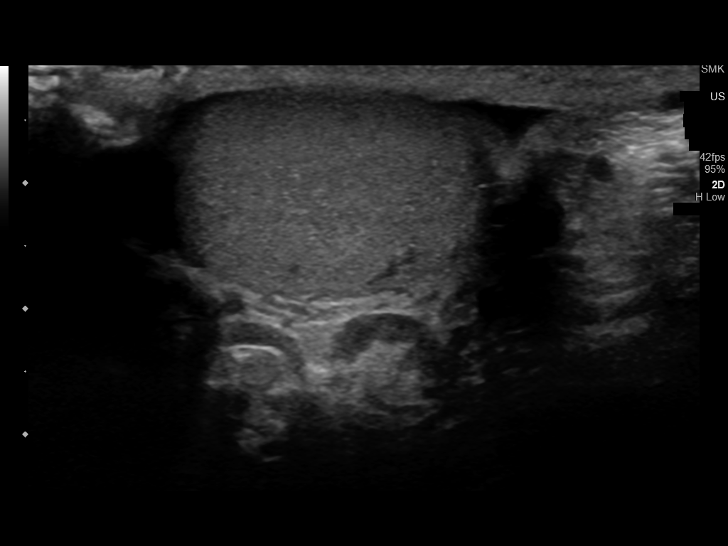
[im 18/72]
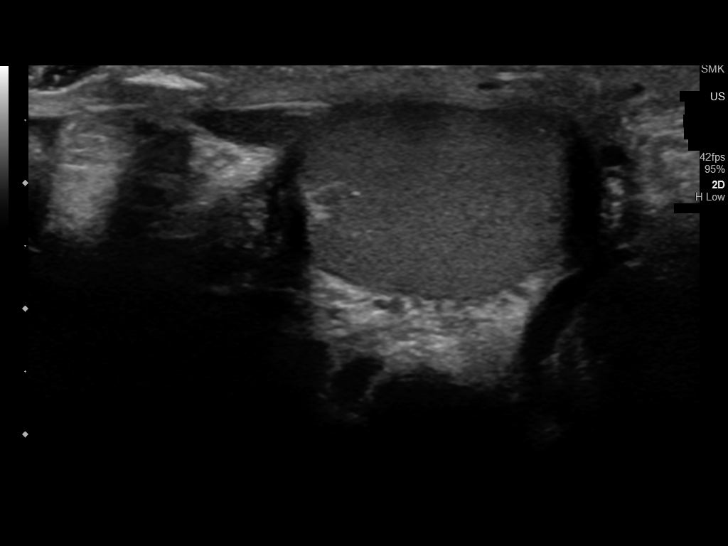
[im 24/72]
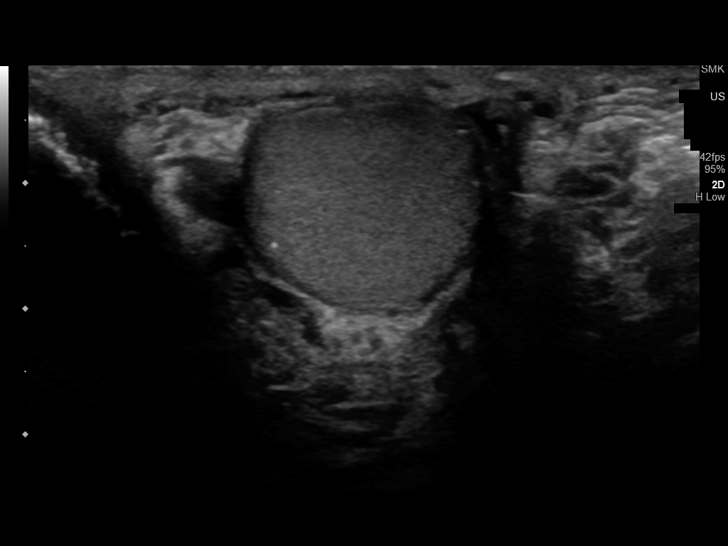
[im 30/72]
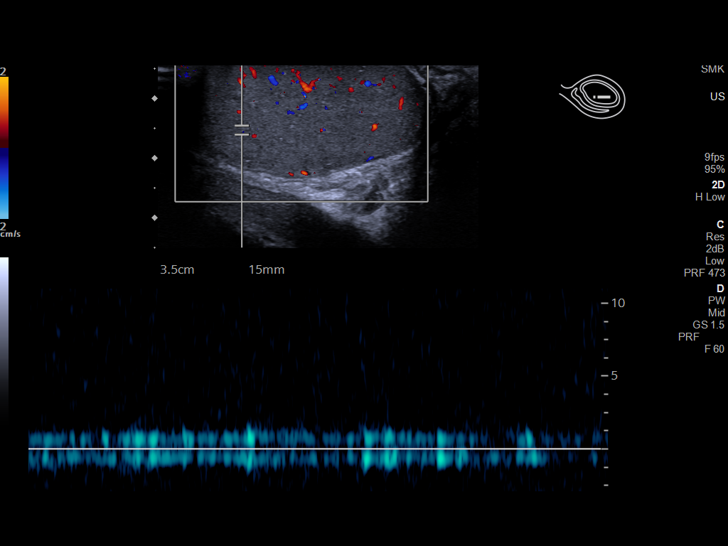
[im 36/72]
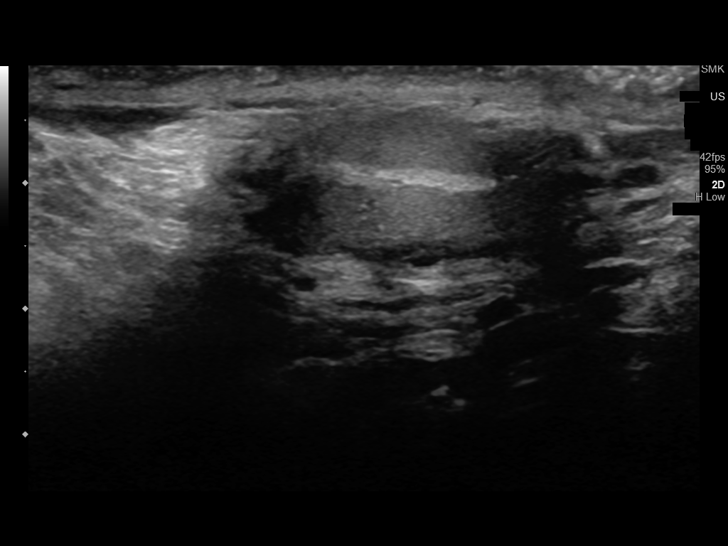
[im 42/72]
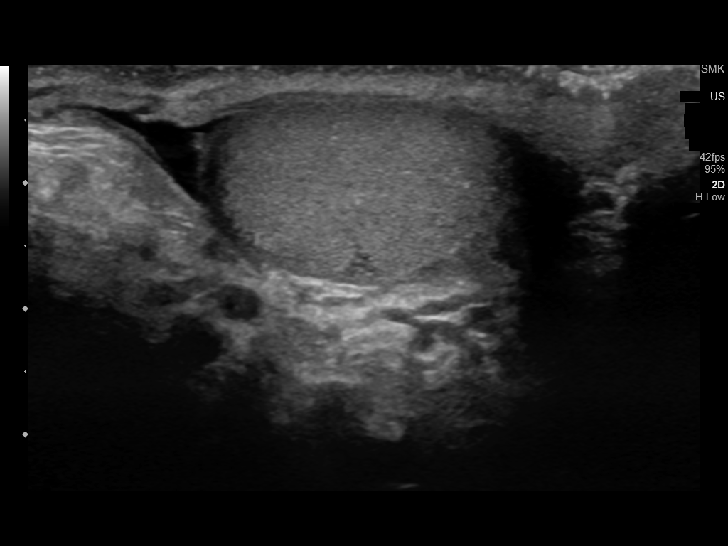
[im 48/72]
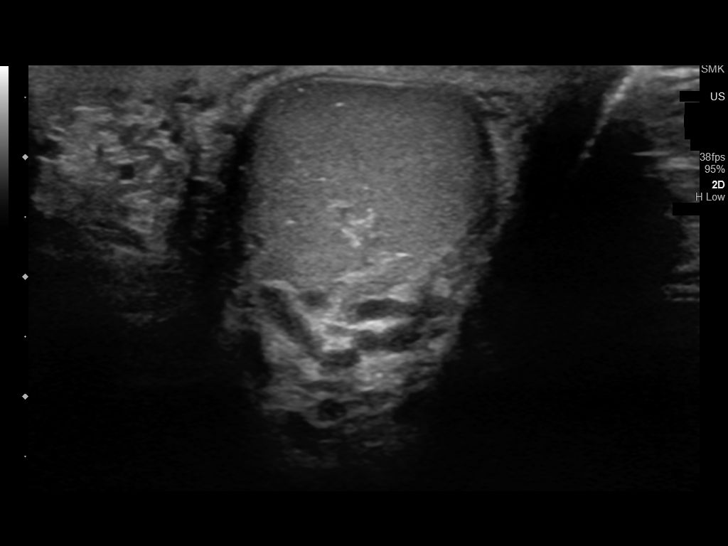
[im 54/72]
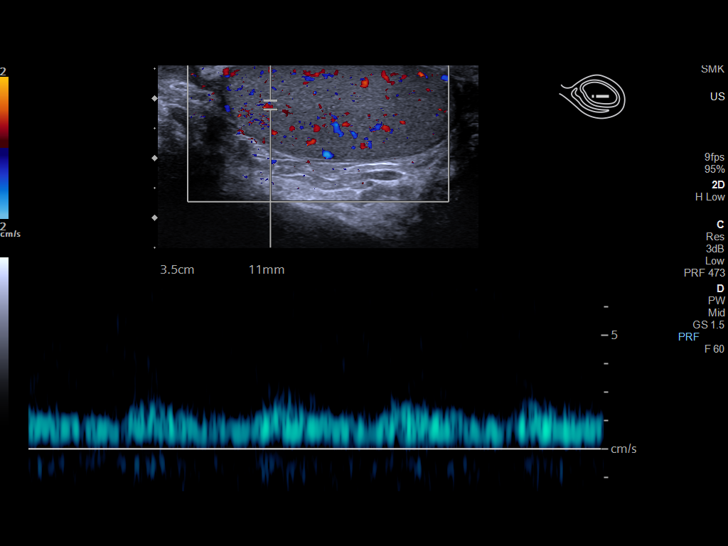
[im 60/72]
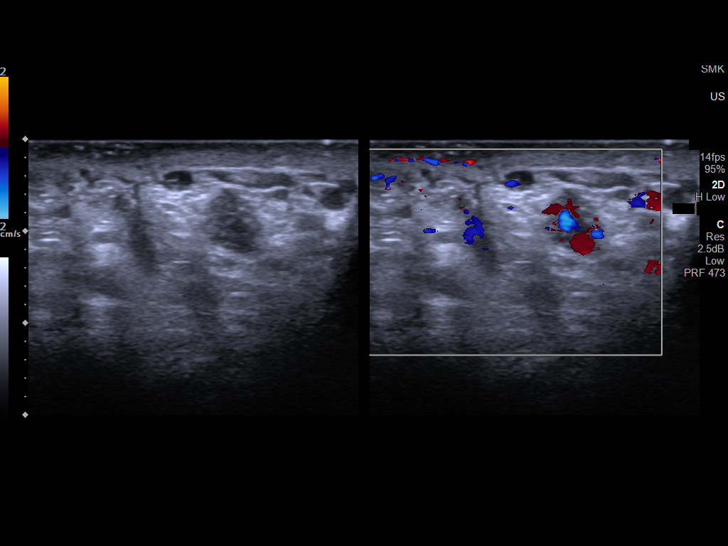
[im 66/72]
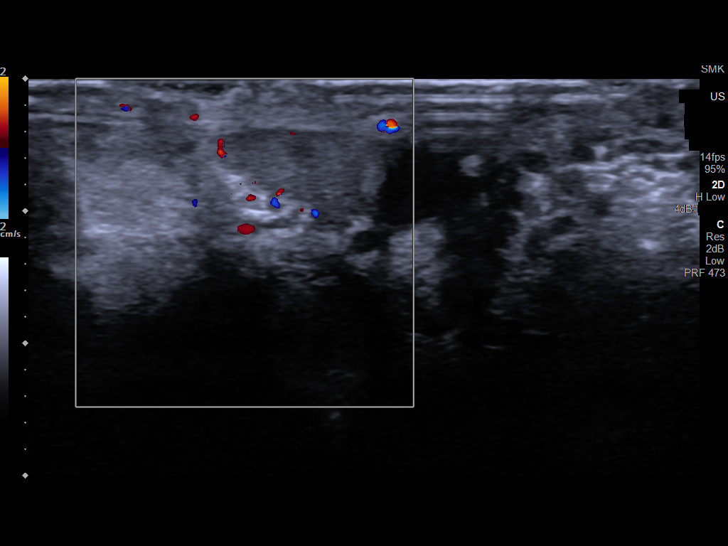
[im 72/72]
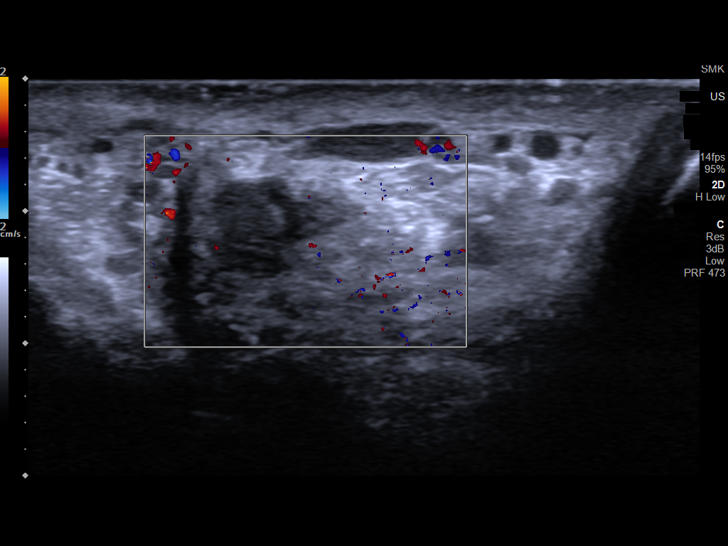

[13 of 25 positions shown; findings below may reference images not displayed]

FINDINGS: Right testicle

Measurements: 3.9 x 2.0 x 2.6 cm. No mass. Scattered
microcalcifications.

Left testicle

Measurements: 3.9 x 1.7 x 2.5 cm. No mass. Scattered
microcalcifications.

Right epididymis:  Normal in size and appearance.

Left epididymis:  Normal in size and appearance.

Hydrocele:  None visualized.

Varicocele:  None visualized.

Pulsed Doppler interrogation of both testes demonstrates normal low
resistance arterial and venous waveforms bilaterally.
IMPRESSION: No testicular mass or evidence of torsion.

Bilateral microlithiasis. Current literature suggests that
testicular microlithiasis is not a significant independent risk
factor for development of testicular carcinoma, and that follow up
imaging is not warranted in the absence of other risk factors.
Monthly testicular self-examination and annual physical exams are
considered appropriate surveillance. If patient has other risk
factors for testicular carcinoma, then referral to Urology should be
considered. (Reference: Miyu, et al.: A 5-Year Follow up Study
of Asymptomatic Men with Testicular Microlithiasis. J Urol 4998;
